# Patient Record
Sex: Female | Born: 1970 | Hispanic: No | Marital: Married | Smoking: Never smoker
Health system: Southern US, Community
[De-identification: ages and names within clinical notes are randomized; demographics above are authoritative.]

## PROBLEM LIST (undated history)

## (undated) DIAGNOSIS — E039 Hypothyroidism, unspecified: Secondary | ICD-10-CM

## (undated) DIAGNOSIS — F419 Anxiety disorder, unspecified: Secondary | ICD-10-CM

## (undated) DIAGNOSIS — K219 Gastro-esophageal reflux disease without esophagitis: Secondary | ICD-10-CM

## (undated) DIAGNOSIS — Z8541 Personal history of malignant neoplasm of cervix uteri: Secondary | ICD-10-CM

## (undated) DIAGNOSIS — E7800 Pure hypercholesterolemia, unspecified: Secondary | ICD-10-CM

## (undated) HISTORY — DX: Anxiety disorder, unspecified: F41.9

## (undated) HISTORY — PX: COLONOSCOPY: SHX174

## (undated) HISTORY — DX: Personal history of malignant neoplasm of cervix uteri: Z85.41

## (undated) HISTORY — DX: Gastro-esophageal reflux disease without esophagitis: K21.9

## (undated) HISTORY — DX: Pure hypercholesterolemia, unspecified: E78.00

## (undated) HISTORY — DX: Hypothyroidism, unspecified: E03.9

---

## 2002-01-19 HISTORY — PX: RADICAL HYSTERECTOMY: SHX2283

## 2008-06-07 ENCOUNTER — Ambulatory Visit: Payer: Self-pay | Admitting: Gastroenterology

## 2008-06-07 DIAGNOSIS — K219 Gastro-esophageal reflux disease without esophagitis: Secondary | ICD-10-CM

## 2008-06-07 DIAGNOSIS — R197 Diarrhea, unspecified: Secondary | ICD-10-CM

## 2008-06-07 LAB — CONVERTED CEMR LAB
BUN: 15 mg/dL (ref 6–23)
CO2: 29 meq/L (ref 19–32)
CRP, High Sensitivity: <1 (ref 0.00–5.00)
Calcium: 9.2 mg/dL (ref 8.4–10.5)
Chloride: 107 meq/L (ref 96–112)
Creatinine, Ser: 0.7 mg/dL (ref 0.4–1.2)
Eosinophils Relative: 3.2 % (ref 0.0–5.0)
GFR calc non Af Amer: 99.21 mL/min (ref >60)
Glucose, Bld: 90 mg/dL (ref 70–99)
HCT: 35.6 % — ABNORMAL LOW (ref 36.0–46.0)
Hemoglobin: 12.6 g/dL (ref 12.0–15.0)
Lymphs Abs: 1.8 10*3/uL (ref 0.7–4.0)
MCV: 91.4 fL (ref 78.0–100.0)
Monocytes Absolute: 0.6 10*3/uL (ref 0.1–1.0)
Monocytes Relative: 9.6 % (ref 3.0–12.0)
Neutro Abs: 3.3 10*3/uL (ref 1.4–7.7)
Platelets: 226 10*3/uL (ref 150.0–400.0)
Total Bilirubin: 0.7 mg/dL (ref 0.3–1.2)
WBC: 5.9 10*3/uL (ref 4.5–10.5)

## 2008-06-20 ENCOUNTER — Encounter: Payer: Self-pay | Admitting: Gastroenterology

## 2008-06-20 ENCOUNTER — Ambulatory Visit: Payer: Self-pay | Admitting: Gastroenterology

## 2008-06-25 ENCOUNTER — Encounter: Payer: Self-pay | Admitting: Gastroenterology

## 2014-07-03 ENCOUNTER — Encounter: Payer: Self-pay | Admitting: Gastroenterology

## 2014-08-20 HISTORY — PX: UPPER GASTROINTESTINAL ENDOSCOPY: SHX188

## 2014-09-10 ENCOUNTER — Encounter: Payer: Self-pay | Admitting: Gastroenterology

## 2014-09-10 ENCOUNTER — Ambulatory Visit (INDEPENDENT_AMBULATORY_CARE_PROVIDER_SITE_OTHER): Payer: BLUE CROSS/BLUE SHIELD | Admitting: Gastroenterology

## 2014-09-10 VITALS — BP 104/68 | HR 68 | Ht 66.0 in | Wt 148.6 lb

## 2014-09-10 DIAGNOSIS — R490 Dysphonia: Secondary | ICD-10-CM | POA: Diagnosis not present

## 2014-09-10 NOTE — Patient Instructions (Addendum)
Research will be speaking with you today. You have been scheduled for an endoscopy. Please follow written instructions given to you at your visit today. If you use inhalers (even only as needed), please bring them with you on the day of your procedure.

## 2014-09-10 NOTE — Assessment & Plan Note (Signed)
Hoarseness is likely a result of ongoing reflux.  Have asked to her nonacid reflux.  Recommendations #1 upper endoscopy #2 if #1 was not diagnostic would consider 48-hour probable pH test with impedance plethysmography #3 patient may consider enrollment in a reflux trial  CC Dr. Carlena Sax

## 2014-09-10 NOTE — Progress Notes (Signed)
_                                                                                                                History of Present Illness:  Ms. Banbury is a pleasant 44 year old white female referred at the request of Dr. Carlena Sax for evaluation of hoarseness.  This has been a problem for 9 months.  She's had severe hoarseness and intermittent sore throat despite taking various PPIs including dexilant Nexium.  She had been on Protonix for years.  She currently takes AcipHex in the morning and ranitidine at night.  She has been evaluated by ENT who felt that her changes on exam consistent with acid reflux.  Local cord nodules were seen.  She denies dysphagia or cough.  She's on no gastric irritants including non-steroidals.   Past Medical History  Diagnosis Date  . High cholesterol   . Hypothyroidism   . Anxiety   . Hx of cervical cancer   . GERD (gastroesophageal reflux disease)    Past Surgical History  Procedure Laterality Date  . Radical hysterectomy  2004   family history includes Crohn's disease in her sister; Hypertension in her mother; Non-Hodgkin's lymphoma in her father. Current Outpatient Prescriptions  Medication Sig Dispense Refill  . ALPRAZolam (XANAX) 0.5 MG tablet Take 0.5 mg by mouth as needed for anxiety.    . cetirizine (ZYRTEC) 10 MG tablet Take 10 mg by mouth daily.    . citalopram (CELEXA) 40 MG tablet Take 40 mg by mouth daily.    Marland Kitchen levothyroxine (SYNTHROID, LEVOTHROID) 100 MCG tablet Take 100 mcg by mouth daily before breakfast.    . MEGARED OMEGA-3 KRILL OIL 500 MG CAPS Take 1 tablet by mouth daily.    . montelukast (SINGULAIR) 10 MG tablet Take 10 mg by mouth at bedtime.    . nitrofurantoin, macrocrystal-monohydrate, (MACROBID) 100 MG capsule Take 100 mg by mouth daily.    . RABEprazole (ACIPHEX) 20 MG tablet Take 20 mg by mouth daily.    . ranitidine (ZANTAC) 150 MG capsule Take 150 mg by mouth at bedtime.    . rosuvastatin (CRESTOR) 10 MG  tablet Take 10 mg by mouth daily.     No current facility-administered medications for this visit.   Allergies as of 09/10/2014 - Review Complete 09/10/2014  Allergen Reaction Noted  . Codeine    . Hydrocodone    . Sulfonamide derivatives      reports that she has never smoked. She does not have any smokeless tobacco history on file. She reports that she drinks alcohol. She reports that she does not use illicit drugs.   Review of Systems: Pertinent positive and negative review of systems were noted in the above HPI section. All other review of systems were otherwise negative.  Vital signs were reviewed in today's medical record Physical Exam: General: Well developed , well nourished, no acute distress Skin: anicteric Head: Normocephalic and atraumatic Eyes:  sclerae anicteric, EOMI Ears: Normal auditory acuity Mouth: No deformity or lesions  Neck: Supple, no masses or thyromegaly Lymph Nodes: no lymphadenopathy Lungs: Clear throughout to auscultation Heart: Regular rate and rhythm; no murmurs, rubs or bruits Gastroinestinal: Soft, non tender and non distended. No masses, hepatosplenomegaly or hernias noted. Normal Bowel sounds.  There is no succussion splash Rectal:deferred Musculoskeletal: Symmetrical with no gross deformities  Skin: No lesions on visible extremities Pulses:  Normal pulses noted Extremities: No clubbing, cyanosis, edema or deformities noted Neurological: Alert oriented x 4, grossly nonfocal Cervical Nodes:  No significant cervical adenopathy Inguinal Nodes: No significant inguinal adenopathy Psychological:  Alert and cooperative. Normal mood and affect  See Assessment and Plan under Problem List

## 2014-09-11 ENCOUNTER — Other Ambulatory Visit: Payer: Self-pay

## 2014-09-11 ENCOUNTER — Encounter: Payer: Self-pay | Admitting: Gastroenterology

## 2014-09-11 ENCOUNTER — Ambulatory Visit (AMBULATORY_SURGERY_CENTER): Payer: BLUE CROSS/BLUE SHIELD | Admitting: Gastroenterology

## 2014-09-11 VITALS — BP 107/63 | HR 62 | Temp 97.5°F | Resp 24 | Ht 66.0 in | Wt 148.0 lb

## 2014-09-11 DIAGNOSIS — R490 Dysphonia: Secondary | ICD-10-CM

## 2014-09-11 MED ORDER — SODIUM CHLORIDE 0.9 % IV SOLN: 500.0000 mL | INTRAVENOUS | Status: DC

## 2014-09-11 NOTE — Progress Notes (Signed)
Report to PACU, RN, vss, BBS= Clear.  

## 2014-09-11 NOTE — Patient Instructions (Signed)
YOU HAD AN ENDOSCOPIC PROCEDURE TODAY AT THE [facility]:   Refer to the procedure report that was given to you for any specific questions about what was found during the examination.  If the procedure report does not answer your questions, please call your gastroenterologist to clarify.  If you requested that your care partner not be given the details of your procedure findings, then the procedure report has been included in a sealed envelope for you to review at your convenience later.  YOU SHOULD EXPECT: Some feelings of bloating in the abdomen. Passage of more gas than usual.  Walking can help get rid of the air that was put into your GI tract during the procedure and reduce the bloating. If you had a lower endoscopy (such as a colonoscopy or flexible sigmoidoscopy) you may notice spotting of blood in your stool or on the toilet paper. If you underwent a bowel prep for your procedure, you may not have a normal bowel movement for a few days.  Please Note:  You might notice some irritation and congestion in your nose or some drainage.  This is from the oxygen used during your procedure.  There is no need for concern and it should clear up in a day or so.  SYMPTOMS TO REPORT IMMEDIATELY:    Following upper endoscopy (EGD)  Vomiting of blood or coffee ground material  New chest pain or pain under the shoulder blades  Painful or persistently difficult swallowing  New shortness of breath  Fever of 100F or higher  Black, tarry-looking stools  For urgent or emergent issues, a gastroenterologist can be reached at any hour by calling [phone].   DIET: Your first meal following the procedure should be a small meal and then it is ok to progress to your normal diet. Heavy or fried foods are harder to digest and may make you feel nauseous or bloated.  Likewise, meals heavy in dairy and vegetables can increase bloating.  Drink plenty of fluids but you should avoid alcoholic beverages  for 24 hours.  ACTIVITY:  You should plan to take it easy for the rest of today and you should NOT DRIVE or use heavy machinery until tomorrow (because of the sedation medicines used during the test).    FOLLOW UP: Our staff will call the number listed on your records the next business day following your procedure to check on you and address any questions or concerns that you may have regarding the information given to you following your procedure. If we do not reach you, we will leave a message.  However, if you are feeling well and you are not experiencing any problems, there is no need to return our call.  We will assume that you have returned to your regular daily activities without incident.  If any biopsies were taken you will be contacted by phone or by letter within the next 1-3 weeks.  Please call us at [phone] if you have not heard about the biopsies in 3 weeks.    SIGNATURES/CONFIDENTIALITY: You and/or your care partner have signed paperwork which will be entered into your electronic medical record.  These signatures attest to the fact that that the information above on your After Visit Summary has been reviewed and is understood.  Full responsibility of the confidentiality of this discharge information lies with you and/or your care-partner. 

## 2014-09-11 NOTE — Op Note (Signed)
[facility] [address] [place], [address]   ENDOSCOPY PROCEDURE REPORT  PATIENT: Karen Obrien, Karen Obrien  MR#: [id number] BIRTHDATE: [birth year] , 43  yrs. old GENDER: female ENDOSCOPIST: Louis Meckel, MD REFERRED BY: PROCEDURE DATE:  09/11/2014 PROCEDURE:  EGD, diagnostic ASA CLASS:     Class II INDICATIONS:  hoarseness and history of esophageal reflux. MEDICATIONS: Monitored anesthesia care and Propofol 130 mg IV TOPICAL ANESTHETIC:  DESCRIPTION OF PROCEDURE: After the risks benefits and alternatives of the procedure were thoroughly explained, informed consent was obtained.  The LB [id number] endoscope was introduced through the mouth and advanced to the second portion of the duodenum , Without limitations.  The instrument was slowly withdrawn as the mucosa was fully examined.      EXAM: The esophagus and gastroesophageal junction were completely normal in appearance.  The stomach was entered and closely examined.The antrum, angularis, and lesser curvature were well visualized, including a retroflexed view of the cardia and fundus. The stomach wall was normally distensable.  The scope passed easily through the pylorus into the duodenum.  Retroflexed views revealed no abnormalities.     The scope was then withdrawn from the patient and the procedure completed.  COMPLICATIONS: There were no immediate complications.  ENDOSCOPIC IMPRESSION: Normal appearing esophagus and GE junction, the stomach was well visualized and normal in appearance, normal appearing duodenum  RECOMMENDATIONS: bravo 48-hour pH study with impedance plethysmography  REPEAT EXAM:  eSigned:  Louis Meckel, MD 09/11/2014 10:16 AM    CC: Adair Laundry, MD

## 2014-09-12 ENCOUNTER — Telehealth: Payer: Self-pay | Admitting: *Deleted

## 2014-09-12 NOTE — Telephone Encounter (Signed)
  Follow up Call-  Call back number 09/11/2014  Post procedure Call Back phone  # [phone]  Permission to leave phone message Yes     Patient questions:  Do you have a fever, pain , or abdominal swelling? No. Pain Score  0 *  Have you tolerated food without any problems? Yes.    Have you been able to return to your normal activities? Yes.    Do you have any questions about your discharge instructions: Diet   No. Medications  No. Follow up visit  No.  Do you have questions or concerns about your Care? No.  Actions: * If pain score is 4 or above: No action needed, pain <4.

## 2014-10-01 ENCOUNTER — Encounter: Payer: Self-pay | Admitting: Internal Medicine

## 2014-10-01 ENCOUNTER — Ambulatory Visit (HOSPITAL_COMMUNITY)
Admission: RE | Admit: 2014-10-01 | Discharge: 2014-10-01 | Disposition: A | Discharge status: Home / Self Care | Payer: BLUE CROSS/BLUE SHIELD | Source: Ambulatory Visit | Attending: Gastroenterology | Admitting: Gastroenterology

## 2014-10-01 ENCOUNTER — Encounter (HOSPITAL_COMMUNITY)
Admission: RE | Disposition: A | Discharge status: Home / Self Care | Payer: Self-pay | Source: Ambulatory Visit | Attending: Gastroenterology

## 2014-10-01 DIAGNOSIS — K219 Gastro-esophageal reflux disease without esophagitis: Secondary | ICD-10-CM | POA: Diagnosis not present

## 2014-10-01 DIAGNOSIS — R490 Dysphonia: Secondary | ICD-10-CM | POA: Diagnosis not present

## 2014-10-01 HISTORY — PX: PH IMPEDANCE STUDY: SHX5565

## 2014-10-01 HISTORY — PX: ESOPHAGEAL MANOMETRY: SHX5429

## 2014-10-01 SURGERY — IMPEDANCE PH STUDY, ESOPHAGUS

## 2014-10-01 MED ORDER — LIDOCAINE VISCOUS 2 % MT SOLN
OROMUCOSAL | Status: AC
  Filled 2014-10-01: qty 15

## 2014-10-01 SURGICAL SUPPLY — 2 items
FACESHIELD LNG OPTICON STERILE (SAFETY) IMPLANT
GLOVE BIO SURGEON STRL SZ8 (GLOVE) ×6 IMPLANT

## 2014-10-03 ENCOUNTER — Encounter (HOSPITAL_COMMUNITY): Payer: Self-pay | Admitting: Gastroenterology

## 2014-10-09 NOTE — Procedures (Signed)
Patient: Karen Obrien, Karen Obrien   [id number] Gender: Female Physician: Dr. Stan Head   DOB / Age: [birth year] Operator: Dow Adolph RN   Height: 5 ft 6 in Referring Physician:    Procedure: EMZ Examination Date: 10/01/2014    Swallow Composite (mean of 10 swallows) Resting Pressure Profile & Anatomy     Basal Pressures* LES, respiratory mean(mmHg) 18.3 (13-43) UES mean(mmHg) 54.8 (34-104)  Anatomy* LES proximal(cm) 41.6 LES intraabdominal(cm) 1.4 Esophageal length(cm) 24.0 Hiatal hernia No    Motility* Distal contr. integral(mmHg-cm-s) 1528.1 ([phone]) Distal contr. int. (highest)(mmHg-cm-s) 1802.2 Incomplete bolus clearance(%) 0  Residual Pressures* LES (mean)(mmHg) 3.9 (<15.0) UES (mean)(mmHg) 7.4 (<12.0)  *Notes. Motility values are mean among swallows; Normal values in (xxx.x):  Simultaneous contractions: Velocity > 8.0 cm/s; eSlv: eSleeve; 3SN, IRP, DCI, IBP - See manual definitions  Lower Esophageal Sphincter Region  Normal Esophageal Motility  Normal  Landmarks   Number of swallows evaluated 10       Proximal LES (from nares)(cm) 41.6  High Resolution Parameters        LES length(cm) 3.1 2.7-4.8     Distal contractile integral(mean)(mmHg-cm-s) 1528.1 [phone]      Esophageal length (LES-UES centers)(cm) 24.0      Distal contractile integral(highest)(mmHg-cm-s) 1802.2       Intraabdominal LES length(cm) 1.4      Contractile front velocity(cm/s) 2.6 <9.0      Hiatal hernia? No  Chicago Classification    LES Pressures       Distal latency 6.8       Pressure meas. method eSleeve,IRP      % failed (Chicago Classification) 0       Basal (respiratory min.)(mmHg) 10.5 4.8-32.0     % panesophageal pressurization 0       Basal (respiratory mean)(mmHg) 18.3 13-43     % premature contraction 0       Residual (mean)(mmHg) 3.9 <15.0     % rapid contraction 0          % large breaks 0          % small breaks 10      Impedance analysis           Incomplete bolus clearance(%) 0     Upper Esophageal Sphincter  Normal Pharyngeal / UES Motility  Normal  Mean basal pressure(mmHg) 54.8 34-104 No. swallows evaluated 10   Mean residual pressure(mmHg) 7.4 <12.0 Evaluated @ 3.0 & N/A above UES           Mean peak pressure(mmHg) 6.1       Chicago Classification Findings*  No Chicago Classification abnormality found * Findings are based on published Chicago Classification scheme and are only intended to serve as a guide for patient diagnosis   Procedure  After confirmation of potential allergies, a topical analgesic was used to numb the nares followed by trans-nasal insertion of a High Resolution Manometry Catheter. Pressure bands of both UES and LES were observed on the color contour. Patient instructed to take deep breath to verify placement of catheter; diaphragmatic pinch noted on inspiration. Patient was assisted to supine position and catheter was stabilized. Patient encouraged to relax while acclimating to catheter for approximately 5 minutes. A 30 second baseline pressure was obtained to identify the UES and LES followed by a series of wet swallows using 5 ccs room temperature water to assess esophageal motility. At the conclusion of the procedure; the catheter was removed.   Indications  GERD, hoarseness   Interpretation /  Findings   Normal Esophageal Manometry Study. Impedance results to follow  Iva Boop, MD, [facility] E sign 10/09/2014 1330

## 2014-12-18 ENCOUNTER — Telehealth: Payer: Self-pay | Admitting: Gastroenterology

## 2014-12-18 NOTE — Telephone Encounter (Signed)
She had a Bravo 24 hr impedence study. The results are in her chart. Could you review this for me and give me your recommendations?

## 2014-12-21 NOTE — Telephone Encounter (Signed)
Esophageal manometry report was normal, I couldn't find the 48 hr pH Bravo report

## 2014-12-24 NOTE — Telephone Encounter (Signed)
Left message with the information. DPR is on file.

## 2015-02-14 DIAGNOSIS — R490 Dysphonia: Secondary | ICD-10-CM | POA: Insufficient documentation

## 2015-02-18 ENCOUNTER — Telehealth: Payer: Self-pay | Admitting: Gastroenterology

## 2015-02-18 NOTE — Telephone Encounter (Signed)
Patient given results and recommendations. She states she ended up in ED and has seen Dr. Gerilyn Nestle now.

## 2015-02-18 NOTE — Telephone Encounter (Signed)
Rene Kocher this patient had a 24 HR pH impedance study done for Dr. Arlyce Dice back in September but it was not known to use as it occurred after Dr. Arlyce Dice had left the clinic. I interpreted the study for him when asked by the [facility] endo dept.   Summary and Interpretation: - normal Demeester score, suggesting no pathologic acid reflux - symptoms did not correlate to reflux episodes This is a normal study. Records reviewed showing the patient's symptom is voice hoarseness. This study would indicate that her hoarse voice is not the result of reflux. If she is not on PPI, there is no pathologic acid reflux. If she is ON PPI, it is controlling reflux well and if symptoms persist, they would not seem to be related to reflux  Recommend: - based on this exam there is no evidence of reflux to cause her symptoms of hoarse voice. If her symptoms persist and she has not seen ENT yet, we can place a consult for her to see them if she wishes to have this done. Can you please otherwise let her know why her results too so long to get into the system, we apologize for the delay. Thanks

## 2015-03-12 ENCOUNTER — Ambulatory Visit (INDEPENDENT_AMBULATORY_CARE_PROVIDER_SITE_OTHER): Payer: BLUE CROSS/BLUE SHIELD | Admitting: Internal Medicine

## 2015-03-12 ENCOUNTER — Encounter (INDEPENDENT_AMBULATORY_CARE_PROVIDER_SITE_OTHER): Payer: Self-pay | Admitting: *Deleted

## 2015-03-12 ENCOUNTER — Encounter (INDEPENDENT_AMBULATORY_CARE_PROVIDER_SITE_OTHER): Payer: Self-pay | Admitting: Internal Medicine

## 2015-03-12 VITALS — BP 98/70 | HR 68 | Temp 97.7°F | Resp 18 | Ht 66.0 in | Wt 157.9 lb

## 2015-03-12 DIAGNOSIS — R490 Dysphonia: Secondary | ICD-10-CM | POA: Diagnosis not present

## 2015-03-12 DIAGNOSIS — K219 Gastro-esophageal reflux disease without esophagitis: Secondary | ICD-10-CM | POA: Diagnosis not present

## 2015-03-12 MED ORDER — PANTOPRAZOLE SODIUM 40 MG PO TBEC: 40.0000 mg | DELAYED_RELEASE_TABLET | Freq: Two times a day (BID) | ORAL | Status: AC

## 2015-03-12 NOTE — Patient Instructions (Addendum)
Physician will call with results of gastric emptying study. Remember to take pantoprazole 30 to 60 minutes before breakfast and evening meal.

## 2015-03-12 NOTE — Progress Notes (Signed)
Presenting complaint;  Chronic hoarseness unresponsive to PPI therapy.  History of present illness.:  Patient is 45 year old Caucasian female who is referred through the courtesy of Dr. Wende Crease for 45GI evaluation. Patient was admitted to [facility] on 01/25/2015 for severe chest pain. Troponin levels were negative. Stress echo was also within normal limits. Chest pain was felt to be noncardiac possibly due to GERD. Patient states she developed sore throat 45 years ago. She was seen by ENT specialist in [place] and was diagnosed with GERD. She was begun on pantoprazole which she took every day for 5 years with satisfactory symptom control. She also slept on 2 pillows. Her sore throat relapsed about 5 years ago and she was switched to Dexilant which provided relief for 4 years. She developed hoarseness last year and was once again seen by ENT specialist since she was switched to Rebaprazole along with Zantac at bedtime. However her hoarseness has not improved. She was seen by Dr. Louis Meckel in August 45 last year. She had normal EGD, normal esophageal manometry and normal pH and impedance study. Dr. Arlyce Dice was moved and she is not able to follow-up with him. In addition to hoarseness she also has intermittent sore throat. She has sporadic cough. She has never experienced heartburn. She is watching her diet closely. She drinks no more than 46 cups of tea a day. She says she is been treated for pneumonia on 3 different occasions. She was treated in October 2016 with CPAP and prednisone and again with Levaquin in November last year. When she was admitted to [facility] last month she was told she had pneumonia and was sent home on Levaquin. Chest film report suggested atelectasis vs  infiltrate. She has good appetite. Since she has gained 35 pounds in the last 4 years she has been watching her diet. She has occasional dysphagia with solids. She denies vomiting abdominal pain melena or rectal  bleeding.    Current Medications: Outpatient Encounter Prescriptions as of 03/12/2015  Medication Sig  . ALPRAZolam (XANAX) 0.5 MG tablet Take 0.5 mg by mouth as needed for anxiety.  . citalopram (CELEXA) 40 MG tablet Take 20 mg by mouth daily.   Marland Kitchen levothyroxine (SYNTHROID, LEVOTHROID) 100 MCG tablet Take 100 mcg by mouth daily before breakfast.  . nitrofurantoin, macrocrystal-monohydrate, (MACROBID) 100 MG capsule Take 100 mg by mouth daily.  . RABEprazole (ACIPHEX) 20 MG tablet Take 20 mg by mouth daily.  . rosuvastatin (CRESTOR) 10 MG tablet Take 10 mg by mouth daily.  . cetirizine (ZYRTEC) 10 MG tablet Take 10 mg by mouth daily. Reported on 03/12/2015  . [DISCONTINUED] MEGARED OMEGA-3 KRILL OIL 500 MG CAPS Take 1 tablet by mouth daily. Reported on 03/12/2015  . [DISCONTINUED] montelukast (SINGULAIR) 10 MG tablet Take 10 mg by mouth at bedtime. Reported on 03/12/2015  . [DISCONTINUED] ranitidine (ZANTAC) 150 MG capsule Take 150 mg by mouth at bedtime. Reported on 03/12/2015   No facility-administered encounter medications on file as of 03/12/2015.   Past medical history: Hypothyroidism was diagnosed 4 years ago. Hyperlipidemia. She has been on therapy for 18 months. Stress disorder. Hysterectomy in 2004. 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 She was hospitalized for UTI in December 2015 and needed PICC line. She has been on Macrobid since then and has not had any more UTI. Recent hospitalization for severe chest pain and noninvasive cardiac workup negative as above. Normal colonoscopy in June 2010.  Allergies: Allergies  Allergen  Reactions  . Codeine   . Hydrocodone   . Sulfonamide Derivatives     Family history: Father had CAD diabetes mellitus and died of non-Hodgkin's lymphoma at age 46. Mother died at age 46. She had hypertension and CLL. She has 3 brothers and 4 sisters. One sister has hypothyroidism and  Crohn's disease. Other sisters are in good health. 2 brothers have coronary artery disease. One has undergone CABG twice and the other brother has had coronary stenting. Third brother has hyperlipidemia.  Social history:  She is married and has a son age 46 in good health. She works as a Interior and spatial designer. She is never smoked cigarettes or drank alcohol.  Physical examination:  Blood pressure 98/70, pulse 68, temperature 97.7 F (36.5 C), temperature source Oral, resp. rate 18, height  (1.676 m), weight 157 lb 14.4 oz (71.623 kg). Patient is alert and in no acute distress. Conjunctiva is pink. Sclera is nonicteric Oropharyngeal mucosa is normal. No neck masses or thyromegaly noted. Cardiac exam with regular rhythm normal S1 and S2. No murmur or gallop noted. Lungs are clear to auscultation. Abdomen is symmetrical soft and nontender without organomegaly or masses.  No LE edema or clubbing noted.  Labs/studies Results: EGD on 09/11/2014 by Dr. Melvia Heaps was normal. Esophageal manometry on 10/01/2014 was within normal limits. Esophageal pH and impedance study on 10/01/2014 was within normal limits.  Study was performed on PPI therapy.   Assessment:  #1. Chronic GERD with laryngeal symptoms unresponsive to current therapy. She had extensive evaluation last year with normal EGD normal esophageal manometry, impedance study and pH study on therapy. Recent brief hospitalization at [facility](6 weeks ago) for chest pain possibly due to GERD as noninvasive cardiac evaluation was negative. Given chronic symptoms need to rule out gastroparesis.   Plan:  Anti-reflux measures reinforced. Discontinue Rebaprazole. Begin pantoprazole 40 mg by mouth before breakfast and evening meal daily. Solid-phase gastric emptying study. Office visit in 2 months.

## 2015-03-13 DIAGNOSIS — N319 Neuromuscular dysfunction of bladder, unspecified: Secondary | ICD-10-CM | POA: Insufficient documentation

## 2015-03-18 ENCOUNTER — Encounter (HOSPITAL_COMMUNITY): Admission: RE | Admit: 2015-03-18 | Payer: BLUE CROSS/BLUE SHIELD | Source: Ambulatory Visit

## 2015-03-25 ENCOUNTER — Encounter (HOSPITAL_COMMUNITY)
Admission: RE | Admit: 2015-03-25 | Discharge: 2015-03-25 | Disposition: A | Discharge status: Home / Self Care | Payer: BLUE CROSS/BLUE SHIELD | Source: Ambulatory Visit | Attending: Internal Medicine | Admitting: Internal Medicine

## 2015-03-25 ENCOUNTER — Encounter (HOSPITAL_COMMUNITY): Payer: Self-pay

## 2015-03-25 DIAGNOSIS — K219 Gastro-esophageal reflux disease without esophagitis: Secondary | ICD-10-CM | POA: Insufficient documentation

## 2015-03-25 MED ORDER — TECHNETIUM TC 99M SULFUR COLLOID
2.0000 | Freq: Once | INTRAVENOUS | Status: AC | PRN
Start: 2015-03-25 — End: 2015-03-25
  Administered 2015-03-25: 2 via ORAL

## 2015-03-26 ENCOUNTER — Encounter (HOSPITAL_COMMUNITY): Payer: BLUE CROSS/BLUE SHIELD

## 2015-03-28 ENCOUNTER — Encounter (INDEPENDENT_AMBULATORY_CARE_PROVIDER_SITE_OTHER): Payer: Self-pay

## 2015-05-14 ENCOUNTER — Ambulatory Visit (INDEPENDENT_AMBULATORY_CARE_PROVIDER_SITE_OTHER): Payer: BLUE CROSS/BLUE SHIELD | Admitting: Internal Medicine

## 2015-06-21 ENCOUNTER — Encounter: Payer: Self-pay | Admitting: Gastroenterology

## 2015-08-13 ENCOUNTER — Ambulatory Visit (INDEPENDENT_AMBULATORY_CARE_PROVIDER_SITE_OTHER): Payer: BLUE CROSS/BLUE SHIELD | Admitting: Internal Medicine

## 2015-08-13 ENCOUNTER — Other Ambulatory Visit (INDEPENDENT_AMBULATORY_CARE_PROVIDER_SITE_OTHER): Payer: Self-pay | Admitting: Internal Medicine

## 2015-08-13 ENCOUNTER — Encounter (INDEPENDENT_AMBULATORY_CARE_PROVIDER_SITE_OTHER): Payer: Self-pay | Admitting: *Deleted

## 2015-08-13 ENCOUNTER — Encounter (INDEPENDENT_AMBULATORY_CARE_PROVIDER_SITE_OTHER): Payer: Self-pay | Admitting: Internal Medicine

## 2015-08-13 ENCOUNTER — Encounter (INDEPENDENT_AMBULATORY_CARE_PROVIDER_SITE_OTHER): Payer: Self-pay

## 2015-08-13 ENCOUNTER — Telehealth (INDEPENDENT_AMBULATORY_CARE_PROVIDER_SITE_OTHER): Payer: Self-pay | Admitting: *Deleted

## 2015-08-13 VITALS — BP 94/70 | HR 60 | Temp 98.2°F | Ht 66.0 in | Wt 159.0 lb

## 2015-08-13 DIAGNOSIS — R1030 Lower abdominal pain, unspecified: Secondary | ICD-10-CM | POA: Diagnosis not present

## 2015-08-13 DIAGNOSIS — K219 Gastro-esophageal reflux disease without esophagitis: Secondary | ICD-10-CM

## 2015-08-13 DIAGNOSIS — K625 Hemorrhage of anus and rectum: Secondary | ICD-10-CM | POA: Insufficient documentation

## 2015-08-13 MED ORDER — PEG 3350-KCL-NA BICARB-NACL 420 G PO SOLR: 4000.0000 mL | Freq: Once | ORAL | 0 refills | Status: AC

## 2015-08-13 NOTE — Telephone Encounter (Signed)
Error   This encounter was created in error - please disregard. 

## 2015-08-13 NOTE — Patient Instructions (Signed)
The risks and benefits such as perforation, bleeding, and infection were reviewed with the patient and is agreeable. 

## 2015-08-13 NOTE — Progress Notes (Signed)
Subjective:    Patient ID: Karen Obrien, female    DOB: [birth year], 45 y.o.   MRN: [id number]  HPI Presents today with c/o rectal bleeding (BRRB). The rectal bleeding x 2 days (last Wednesday and Thursday). She says she had an upset stomach. She says she was not constipated. She describes as a significant amt  Appetite is good. No weight loss. She usually has a BM every 3 days. She shows me a picture in the office and there is BRRB in the commode.  There is no abdominal pain. She takes Xanax about once a month.   Has had LLQ pain for months. No fever associated with her symptoms. GERD controlled with Protonix BID.   ]\     08/08/2015 H and H 12.7 and 38.1  06/20/2008 Colonoscopy: Dr. Arlyce Dice: Normal Colonoscopy Biopsy:   COLON, BIOPSY: BENIGN COLONIC MUCOSA. NO SIGNIFICANT  INFLAMMATION OR OTHER ABNORMALITIES IDENTIFIED. EGD on 09/11/2014 by Dr. Melvia Heaps was normal. Esophageal manometry on 10/01/2014 was within normal limits. Esophageal pH and impedance study on 10/01/2014 was within normal limits.  Study was performed on PPI therapy.  Review of Systems Past Medical History:  Diagnosis Date  . Anxiety   . GERD (gastroesophageal reflux disease)   . High cholesterol   . Hx of cervical cancer   . Hypothyroidism     Past Surgical History:  Procedure Laterality Date  . COLONOSCOPY     Dr. Talbert Nan , Dr.Shiflett  . ESOPHAGEAL MANOMETRY N/A 10/01/2014   Procedure: ESOPHAGEAL MANOMETRY (EM);  Surgeon: Louis Meckel, MD;  Location: [facility];  Service: Endoscopy;  Laterality: N/A;  . PH IMPEDANCE STUDY N/A 10/01/2014   Procedure: PH IMPEDANCE STUDY;  Surgeon: Louis Meckel, MD;  Location: [facility];  Service: Endoscopy;  Laterality: N/A;  . RADICAL HYSTERECTOMY  2004  . UPPER GASTROINTESTINAL ENDOSCOPY  08/2014   Dr. Joselyn Glassman    Allergies  Allergen Reactions  . Codeine   . Hydrocodone   . Sulfonamide Derivatives     Current Outpatient Prescriptions on File  Prior to Visit  Medication Sig Dispense Refill  . ALPRAZolam (XANAX) 0.5 MG tablet Take 0.5 mg by mouth as needed for anxiety.    . cetirizine (ZYRTEC) 10 MG tablet Take 10 mg by mouth daily. Reported on 03/12/2015    . levothyroxine (SYNTHROID, LEVOTHROID) 100 MCG tablet Take 100 mcg by mouth daily before breakfast.    . nitrofurantoin, macrocrystal-monohydrate, (MACROBID) 100 MG capsule Take 100 mg by mouth daily.    . pantoprazole (PROTONIX) 40 MG tablet Take 1 tablet (40 mg total) by mouth 2 (two) times daily before a meal. 60 tablet 5  . rosuvastatin (CRESTOR) 10 MG tablet Take 5 mg by mouth daily.     . citalopram (CELEXA) 40 MG tablet Take 20 mg by mouth daily.      No current facility-administered medications on file prior to visit.        Objective:   Physical Exam   Today's Vitals   08/13/15 1133  BP: 94/70  Pulse: 60  Temp: 98.2 F (36.8 C)  Weight: 159 lb (72.1 kg)  Height:  (1.676 m)   Alert and oriented. Skin warm and dry. Oral mucosa is moist.   . Sclera anicteric, conjunctivae is pink. Thyroid not enlarged. No cervical lymphadenopathy. Lungs clear. Heart regular rate and rhythm.  Abdomen is soft. Bowel sounds are positive. No hepatomegaly. No abdominal masses felt.   Tenderness. LLQ.  No edema to  lower extremities.        Assessment & Plan:  Rectal bleeding. Colonic neoplasm needs to be ruled out.  Ulcer, Polyp also in the differential.  CT abdominal pelvis with CM for LLQ pain to rule out diverticulitis.  GERD controlled with Protonix BID.

## 2015-08-13 NOTE — Telephone Encounter (Signed)
Patient needs trilyte 

## 2015-08-26 ENCOUNTER — Ambulatory Visit (HOSPITAL_COMMUNITY)
Admission: RE | Admit: 2015-08-26 | Discharge: 2015-08-26 | Disposition: A | Discharge status: Home / Self Care | Payer: BLUE CROSS/BLUE SHIELD | Source: Ambulatory Visit | Attending: Internal Medicine | Admitting: Internal Medicine

## 2015-08-26 ENCOUNTER — Encounter (HOSPITAL_COMMUNITY): Payer: Self-pay

## 2015-08-26 DIAGNOSIS — R938 Abnormal findings on diagnostic imaging of other specified body structures: Secondary | ICD-10-CM | POA: Diagnosis not present

## 2015-08-26 DIAGNOSIS — R1030 Lower abdominal pain, unspecified: Secondary | ICD-10-CM | POA: Insufficient documentation

## 2015-08-26 DIAGNOSIS — K625 Hemorrhage of anus and rectum: Secondary | ICD-10-CM | POA: Insufficient documentation

## 2015-08-26 MED ORDER — IOPAMIDOL (ISOVUE-300) INJECTION 61%
100.0000 mL | Freq: Once | INTRAVENOUS | Status: AC | PRN
  Administered 2015-08-26: 100 mL via INTRAVENOUS

## 2015-09-05 ENCOUNTER — Telehealth (INDEPENDENT_AMBULATORY_CARE_PROVIDER_SITE_OTHER): Payer: Self-pay | Admitting: Internal Medicine

## 2015-09-05 DIAGNOSIS — K5289 Other specified noninfective gastroenteritis and colitis: Secondary | ICD-10-CM

## 2015-09-05 MED ORDER — CIPROFLOXACIN HCL 500 MG PO TABS: 500.0000 mg | ORAL_TABLET | Freq: Two times a day (BID) | ORAL | 0 refills | Status: DC

## 2015-09-05 MED ORDER — METRONIDAZOLE 250 MG PO TABS: 250.0000 mg | ORAL_TABLET | Freq: Three times a day (TID) | ORAL | 0 refills | Status: DC

## 2015-09-05 NOTE — Telephone Encounter (Signed)
Results given to patient. Cipro and Flagyl called to her pharmacy. CBC and Sedrate: She will have this drawn Monday.

## 2015-09-05 NOTE — Telephone Encounter (Signed)
Have already talked with patient.

## 2015-09-05 NOTE — Telephone Encounter (Signed)
Patient called, stated she was returning Terri's call.  She stated that she'd have her phone on the rest of the day, to please try her back.  [phone]

## 2015-09-09 LAB — CBC WITH DIFFERENTIAL/PLATELET
Basophils Absolute: 56 cells/uL (ref 0–200)
Basophils Relative: 1 %
EOS PCT: 4 %
Eosinophils Absolute: 224 cells/uL (ref 15–500)
HCT: 37.6 % (ref 35.0–45.0)
HEMOGLOBIN: 13 g/dL (ref 11.7–15.5)
LYMPHS ABS: 1960 {cells}/uL (ref 850–3900)
LYMPHS PCT: 35 %
MCH: 31 pg (ref 27.0–33.0)
MCHC: 34.6 g/dL (ref 32.0–36.0)
MCV: 89.5 fL (ref 80.0–100.0)
MONOS PCT: 12 %
MPV: 9.4 fL (ref 7.5–12.5)
Monocytes Absolute: 672 cells/uL (ref 200–950)
NEUTROS PCT: 48 %
Neutro Abs: 2688 cells/uL (ref 1500–7800)
PLATELETS: 264 10*3/uL (ref 140–400)
RBC: 4.2 MIL/uL (ref 3.80–5.10)
RDW: 12.2 % (ref 11.0–15.0)
WBC: 5.6 10*3/uL (ref 3.8–10.8)

## 2015-09-10 LAB — SEDIMENTATION RATE: SED RATE: 1 mm/h (ref 0–20)

## 2015-10-09 ENCOUNTER — Encounter (HOSPITAL_COMMUNITY)
Admission: RE | Disposition: A | Discharge status: Home / Self Care | Payer: Self-pay | Source: Ambulatory Visit | Attending: Internal Medicine

## 2015-10-09 ENCOUNTER — Encounter (HOSPITAL_COMMUNITY): Payer: Self-pay

## 2015-10-09 ENCOUNTER — Ambulatory Visit (HOSPITAL_COMMUNITY)
Admission: RE | Admit: 2015-10-09 | Discharge: 2015-10-09 | Disposition: A | Discharge status: Home / Self Care | Payer: BLUE CROSS/BLUE SHIELD | Source: Ambulatory Visit | Attending: Internal Medicine | Admitting: Internal Medicine

## 2015-10-09 DIAGNOSIS — K625 Hemorrhage of anus and rectum: Secondary | ICD-10-CM

## 2015-10-09 DIAGNOSIS — K648 Other hemorrhoids: Secondary | ICD-10-CM | POA: Insufficient documentation

## 2015-10-09 DIAGNOSIS — Z79899 Other long term (current) drug therapy: Secondary | ICD-10-CM | POA: Insufficient documentation

## 2015-10-09 DIAGNOSIS — E7800 Pure hypercholesterolemia, unspecified: Secondary | ICD-10-CM | POA: Diagnosis not present

## 2015-10-09 DIAGNOSIS — E039 Hypothyroidism, unspecified: Secondary | ICD-10-CM | POA: Diagnosis not present

## 2015-10-09 DIAGNOSIS — F419 Anxiety disorder, unspecified: Secondary | ICD-10-CM | POA: Diagnosis not present

## 2015-10-09 DIAGNOSIS — Z8541 Personal history of malignant neoplasm of cervix uteri: Secondary | ICD-10-CM | POA: Insufficient documentation

## 2015-10-09 DIAGNOSIS — Z8379 Family history of other diseases of the digestive system: Secondary | ICD-10-CM | POA: Insufficient documentation

## 2015-10-09 DIAGNOSIS — R933 Abnormal findings on diagnostic imaging of other parts of digestive tract: Secondary | ICD-10-CM

## 2015-10-09 DIAGNOSIS — K219 Gastro-esophageal reflux disease without esophagitis: Secondary | ICD-10-CM | POA: Diagnosis not present

## 2015-10-09 HISTORY — PX: COLONOSCOPY: SHX5424

## 2015-10-09 SURGERY — COLONOSCOPY
Anesthesia: Moderate Sedation

## 2015-10-09 MED ORDER — MEPERIDINE HCL 50 MG/ML IJ SOLN
INTRAMUSCULAR | Status: AC
  Filled 2015-10-09: qty 1

## 2015-10-09 MED ORDER — MIDAZOLAM HCL 5 MG/5ML IJ SOLN
INTRAMUSCULAR | Status: AC
  Filled 2015-10-09: qty 10

## 2015-10-09 MED ORDER — SODIUM CHLORIDE 0.9 % IV SOLN
INTRAVENOUS | Status: DC
  Administered 2015-10-09: 1000 mL via INTRAVENOUS

## 2015-10-09 MED ORDER — MEPERIDINE HCL 50 MG/ML IJ SOLN
INTRAMUSCULAR | Status: DC | PRN
  Administered 2015-10-09 (×2): 25 mg via INTRAVENOUS

## 2015-10-09 MED ORDER — STERILE WATER FOR IRRIGATION IR SOLN
Status: DC | PRN
  Administered 2015-10-09: 100 mL

## 2015-10-09 MED ORDER — MIDAZOLAM HCL 5 MG/5ML IJ SOLN
INTRAMUSCULAR | Status: DC | PRN
  Administered 2015-10-09 (×3): 2 mg via INTRAVENOUS

## 2015-10-09 NOTE — Discharge Instructions (Signed)
Resume usual medications and high fiber diet. No driving for 24 hours. Next screening exam in 10 years.  Colonoscopy, Care After Refer to this sheet in the next few weeks. These instructions provide you with information on caring for yourself after your procedure. Your health care provider may also give you more specific instructions. Your treatment has been planned according to current medical practices, but problems sometimes occur. Call your health care provider if you have any problems or questions after your procedure.  Dr Karilyn Cota [phone]; after hours call hospital and have GI doctor on call to be paged. WHAT TO EXPECT AFTER THE PROCEDURE  After your procedure, it is typical to have the following:  A small amount of blood in your stool.  Moderate amounts of gas and mild abdominal cramping or bloating. HOME CARE INSTRUCTIONS  Do not drive, operate machinery, or sign important documents for 24 hours.  You may shower and resume your regular physical activities, but move at a slower pace for the first 24 hours.  Take frequent rest periods for the first 24 hours.  Walk around or put a warm pack on your abdomen to help reduce abdominal cramping and bloating.  Drink enough fluids to keep your urine clear or pale yellow.  You may resume your normal diet as instructed by your health care provider. Avoid heavy or fried foods that are hard to digest.  Avoid drinking alcohol for 24 hours or as instructed by your health care provider.  Only take over-the-counter or prescription medicines as directed by your health care provider. SEEK MEDICAL CARE IF: You have persistent spotting of blood in your stool 2-3 days after the procedure. SEEK IMMEDIATE MEDICAL CARE IF:  You have more than a small spotting of blood in your stool.  You pass large blood clots in your stool.  Your abdomen is swollen (distended).  You have nausea or vomiting.  You have a fever.  You have increasing  abdominal pain that is not relieved with medicine.   This information is not intended to replace advice given to you by your health care provider. Make sure you discuss any questions you have with your health care provider.   Document Released: 08/20/2003 Document Revised: 10/26/2012 Document Reviewed: 09/12/2012 Elsevier Interactive Patient Education 2016 Elsevier Inc.  High-Fiber Diet Fiber, also called dietary fiber, is a type of carbohydrate found in fruits, vegetables, whole grains, and beans. A high-fiber diet can have many health benefits. Your health care provider may recommend a high-fiber diet to help:  Prevent constipation. Fiber can make your bowel movements more regular.  Lower your cholesterol.  Relieve hemorrhoids, uncomplicated diverticulosis, or irritable bowel syndrome.  Prevent overeating as part of a weight-loss plan.  Prevent heart disease, type 2 diabetes, and certain cancers. WHAT IS MY PLAN? The recommended daily intake of fiber includes:  38 grams for men under age 63.  30 grams for men over age 4.  25 grams for women under age 12.  21 grams for women over age 70. You can get the recommended daily intake of dietary fiber by eating a variety of fruits, vegetables, grains, and beans. Your health care provider may also recommend a fiber supplement if it is not possible to get enough fiber through your diet. WHAT DO I NEED TO KNOW ABOUT A HIGH-FIBER DIET?  Fiber supplements have not been widely studied for their effectiveness, so it is better to get fiber through food sources.  Always check the fiber content on thenutrition facts  label of any prepackaged food. Look for foods that contain at least 5 grams of fiber per serving.  Ask your dietitian if you have questions about specific foods that are related to your condition, especially if those foods are not listed in the following section.  Increase your daily fiber consumption gradually. Increasing your  intake of dietary fiber too quickly may cause bloating, cramping, or gas.  Drink plenty of water. Water helps you to digest fiber. WHAT FOODS CAN I EAT? Grains Whole-grain breads. Multigrain cereal. Oats and oatmeal. Brown rice. Barley. Bulgur wheat. Millet. Bran muffins. Popcorn. Rye wafer crackers. Vegetables Sweet potatoes. Spinach. Kale. Artichokes. Cabbage. Broccoli. Green peas. Carrots. Squash. Fruits Berries. Pears. Apples. Oranges. Avocados. Prunes and raisins. Dried figs. Meats and Other Protein Sources Navy, kidney, pinto, and soy beans. Split peas. Lentils. Nuts and seeds. Dairy Fiber-fortified yogurt. Beverages Fiber-fortified soy milk. Fiber-fortified orange juice. Other Fiber bars. The items listed above may not be a complete list of recommended foods or beverages. Contact your dietitian for more options. WHAT FOODS ARE NOT RECOMMENDED? Grains White bread. Pasta made with refined flour. White rice. Vegetables Fried potatoes. Canned vegetables. Well-cooked vegetables.  Fruits Fruit juice. Cooked, strained fruit. Meats and Other Protein Sources Fatty cuts of meat. Fried Environmental education officerpoultry or fried fish. Dairy Milk. Yogurt. Cream cheese. Sour cream. Beverages Soft drinks. Other Cakes and pastries. Butter and oils. The items listed above may not be a complete list of foods and beverages to avoid. Contact your dietitian for more information. WHAT ARE SOME TIPS FOR INCLUDING HIGH-FIBER FOODS IN MY DIET?  Eat a wide variety of high-fiber foods.  Make sure that half of all grains consumed each day are whole grains.  Replace breads and cereals made from refined flour or white flour with whole-grain breads and cereals.  Replace white rice with brown rice, bulgur wheat, or millet.  Start the day with a breakfast that is high in fiber, such as a cereal that contains at least 5 grams of fiber per serving.  Use beans in place of meat in soups, salads, or pasta.  Eat high-fiber  snacks, such as berries, raw vegetables, nuts, or popcorn.   This information is not intended to replace advice given to you by your health care provider. Make sure you discuss any questions you have with your health care provider.   Document Released: 01/05/2005 Document Revised: 01/26/2014 Document Reviewed: 06/20/2013 Elsevier Interactive Patient Education [employer]2016 Elsevier Inc.

## 2015-10-09 NOTE — Op Note (Signed)
[facility] Patient Name: Karen Obrien Procedure Date: 10/09/2015 1:54 PM MRN: [id number] Date of Birth: 11/12/1970 Attending MD: Lionel DecemberNajeeb Aedan Geimer , MD CSN: [id number] Age: [age] Admit Type: Outpatient Procedure:                Colonoscopy Indications:              Rectal bleeding, Abnormal CT of the GI tract Providers:                Lionel DecemberNajeeb Ilisha Blust, MD, Edrick Kinsammy Vaught, RN, Burke Keelsrisann                            Tilley, Technician Referring MD:             Delorse Lekiane W Blair, FNP Medicines:                Meperidine 50 mg IV, Midazolam 6 mg IV Complications:            No immediate complications. Estimated Blood Loss:     Estimated blood loss: none. Procedure:                Pre-Anesthesia Assessment:                           - Prior to the procedure, a History and Physical                            was performed, and patient medications and                            allergies were reviewed. The patient's tolerance of                            previous anesthesia was also reviewed. The risks                            and benefits of the procedure and the sedation                            options and risks were discussed with the patient.                            All questions were answered, and informed consent                            was obtained. Prior Anticoagulants: The patient has                            taken no previous anticoagulant or antiplatelet                            agents. ASA Grade Assessment: II - A patient with                            mild systemic disease. After reviewing the risks  and benefits, the patient was deemed in                            satisfactory condition to undergo the procedure.                           After obtaining informed consent, the colonoscope                            was passed under direct vision. Throughout the                            procedure, the patient's blood pressure, pulse, and                    oxygen saturations were monitored continuously. The                            EC-[id number] ([id number]) scope was introduced through                            the anus and advanced to the the cecum, identified                            by appendiceal orifice and ileocecal valve. The                            colonoscopy was performed without difficulty. The                            patient tolerated the procedure well. The quality                            of the bowel preparation was good. The ileocecal                            valve, appendiceal orifice, and rectum were                            photographed. Scope In: 2:28:07 PM Scope Out: 2:44:58 PM Scope Withdrawal Time: 0 hours 8 minutes 56 seconds  Total Procedure Duration: 0 hours 16 minutes 51 seconds  Findings:      The perianal and digital rectal examinations were normal.      The colon (entire examined portion) appeared normal.      Internal hemorrhoids were found during retroflexion. The hemorrhoids       were small. Impression:               - The entire examined colon is normal.                           - Internal hemorrhoids.                           - No specimens collected. Moderate Sedation:      Moderate (conscious) sedation was administered by  the endoscopy nurse       and supervised by the endoscopist. The following parameters were       monitored: oxygen saturation, heart rate, blood pressure, CO2       capnography and response to care. Total physician intraservice time was       21 minutes. Recommendation:           - Patient has a contact number available for                            emergencies. The signs and symptoms of potential                            delayed complications were discussed with the                            patient. Return to normal activities tomorrow.                            Written discharge instructions were provided to the                             patient.                           - High fiber diet today.                           - Continue present medications.                           - Repeat colonoscopy in 10 years for screening                            purposes. Procedure Code(s):        --- Professional ---                           [phone], Colonoscopy, flexible; diagnostic, including                            collection of specimen(s) by brushing or washing,                            when performed (separate procedure)                           99152, Moderate sedation services provided by the                            same physician or other qualified health care                            professional performing the diagnostic or                            therapeutic service that the sedation  supports,                            requiring the presence of an independent trained                            observer to assist in the monitoring of the                            patient's level of consciousness and physiological                            status; initial 15 minutes of intraservice time,                            patient age 19 years or older Diagnosis Code(s):        --- Professional ---                           K64.8, Other hemorrhoids                           K62.5, Hemorrhage of anus and rectum                           R93.3, Abnormal findings on diagnostic imaging of                            other parts of digestive tract CPT copyright 2016 American Medical Association. All rights reserved. The codes documented in this report are preliminary and upon coder review may  be revised to meet current compliance requirements. Lionel December, MD Lionel December, MD 10/09/2015 2:53:54 PM This report has been signed electronically. Number of Addenda: 0

## 2015-10-09 NOTE — H&P (Signed)
Karen Obrien is an 45 y.o. female.   Chief Complaint: Patient is here for colonoscopy. HPI: Karen Obrien is 45 year old Caucasian female who was seen in the office but 7 weeks ago for rectal bleeding LLQ abdominal pain. Karen Obrien had abdominopelvic CT which suggested mild changes of colitis involving sigmoid colon and rectum. Karen Obrien was treated with Cipro and Flagyl. Karen Obrien has not had any more episodes of rectal bleeding abdominal pain. Karen Obrien is undergoing diagnostic colonoscopy. Family history is negative for CRC or IBD. Last colonoscopy was in 2010.  Past Medical History:  Diagnosis Date  . Anxiety   . GERD (gastroesophageal reflux disease)   . High cholesterol   . Hx of cervical cancer   . Hypothyroidism     Past Surgical History:  Procedure Laterality Date  . COLONOSCOPY     Dr. Talbert NanKlapian , Dr.Shiflett  . ESOPHAGEAL MANOMETRY N/A 10/01/2014   Procedure: ESOPHAGEAL MANOMETRY (EM);  Surgeon: Louis Meckelobert D Kaplan, MD;  Location: [facility];  Service: Endoscopy;  Laterality: N/A;  . PH IMPEDANCE STUDY N/A 10/01/2014   Procedure: PH IMPEDANCE STUDY;  Surgeon: Louis Meckelobert D Kaplan, MD;  Location: [facility];  Service: Endoscopy;  Laterality: N/A;  . RADICAL HYSTERECTOMY  2004  . UPPER GASTROINTESTINAL ENDOSCOPY  08/2014   Dr. Joselyn GlassmanKlapan    Family History  Problem Relation Age of Onset  . Non-Hodgkin's lymphoma Father   . Hypertension Mother   . Heart murmur Son   . Esophagitis Son   . Crohn's disease Sister    Social History:  reports that Karen Obrien has never smoked. Karen Obrien has never used smokeless tobacco. Karen Obrien reports that Karen Obrien drinks alcohol. Karen Obrien reports that Karen Obrien does not use drugs.  Allergies:  Allergies  Allergen Reactions  . Codeine   . Hydrocodone   . Sulfonamide Derivatives     Medications Prior to Admission  Medication Sig Dispense Refill  . ALPRAZolam (XANAX) 0.5 MG tablet Take 0.5 mg by mouth as needed for anxiety.    . cetirizine (ZYRTEC) 10 MG tablet Take 10 mg by mouth daily. Reported on 03/12/2015     . ciprofloxacin (CIPRO) 500 MG tablet Take 1 tablet (500 mg total) by mouth 2 (two) times daily. 20 tablet 0  . citalopram (CELEXA) 40 MG tablet Take 20 mg by mouth daily.     Marland Kitchen. levothyroxine (SYNTHROID, LEVOTHROID) 100 MCG tablet Take 100 mcg by mouth daily before breakfast.    . metroNIDAZOLE (FLAGYL) 250 MG tablet Take 1 tablet (250 mg total) by mouth 3 (three) times daily. 30 tablet 0  . nitrofurantoin, macrocrystal-monohydrate, (MACROBID) 100 MG capsule Take 100 mg by mouth daily.    . pantoprazole (PROTONIX) 40 MG tablet Take 1 tablet (40 mg total) by mouth 2 (two) times daily before a meal. 60 tablet 5  . rosuvastatin (CRESTOR) 10 MG tablet Take 5 mg by mouth daily.       No results found for this or any previous visit (from the past 48 hour(s)). No results found.  ROS  Blood pressure 101/73, pulse 68, temperature 98.4 F (36.9 C), temperature source Oral, resp. rate 14, height 5\' 6"  (1.676 m), weight 160 lb (72.6 kg), SpO2 95 %. Physical Exam  Constitutional: Karen Obrien appears well-developed and well-nourished.  HENT:  Mouth/Throat: Oropharynx is clear and moist.  Eyes: Conjunctivae are normal. No scleral icterus.  Neck: No thyromegaly present.  Cardiovascular: Normal rate and regular rhythm.   No murmur heard. Respiratory: Effort normal and breath sounds normal.  GI: Soft. Karen Obrien exhibits  no distension and no mass. There is no tenderness.  Lymphadenopathy:    Karen Obrien has no cervical adenopathy.  Neurological: Karen Obrien is alert.  Skin: Skin is warm and dry.     Assessment/Plan History of rectal bleeding and ? mild colitis. Diagnostic colonoscopy.  Lionel December, MD 10/09/2015, 2:17 PM

## 2015-10-16 ENCOUNTER — Encounter (HOSPITAL_COMMUNITY): Payer: Self-pay | Admitting: Internal Medicine

## 2016-09-25 ENCOUNTER — Other Ambulatory Visit (INDEPENDENT_AMBULATORY_CARE_PROVIDER_SITE_OTHER): Payer: Self-pay | Admitting: Internal Medicine

## 2017-07-15 IMAGING — NM NM GASTRIC EMPTYING
10 series · 10 of 10 positions shown · non-contrast
Comparison: None.

CLINICAL DATA: Reflux.

EXAM:
NUCLEAR MEDICINE GASTRIC EMPTYING SCAN
TECHNIQUE: After oral ingestion of radiolabeled meal, sequential abdominal
images were obtained for 4 hours. Percentage of activity emptying
the stomach was calculated at 1 hour, 2 hour, 3 hour, and 4 hours.
RADIOPHARMACEUTICALS:  2.0 mCi Ic-22m MDP labeled sulfur colloid
orally

[Series 1: 0 min · 4.14mm/px · 1 of 1 slices shown (1 of 2)]
[im 1/1]
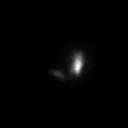

[Series 1: 120 min · 4.14mm/px · 1 of 1 slices shown (1 of 4)]
[im 1/1]
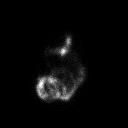

[Series 1: 120 min · 4.14mm/px · 1 of 1 slices shown (2 of 4)]
[im 1/1  full-range]
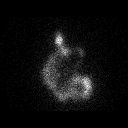

[Series 1: 0 min · 4.14mm/px · 1 of 1 slices shown (2 of 2)]
[im 1/1]
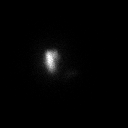

[Series 2: 4 hour · 4.14mm/px · 1 of 1 slices shown (1 of 2)]
[im 1/1]
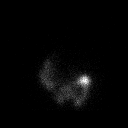

[Series 2: 4 hour · 4.14mm/px · 1 of 1 slices shown (2 of 2)]
[im 1/1]
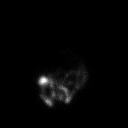

[Series 2: 60 min · 4.14mm/px · 1 of 1 slices shown (1 of 2)]
[im 1/1]
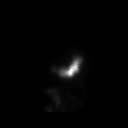

[Series 2: 60 min · 4.14mm/px · 1 of 1 slices shown (2 of 2)]
[im 1/1]
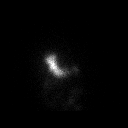

[Series 3: 120 min · 4.14mm/px · 1 of 1 slices shown (3 of 4)]
[im 1/1]
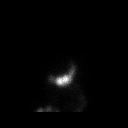

[Series 3: 120 min · 4.14mm/px · 1 of 1 slices shown (4 of 4)]
[im 1/1]
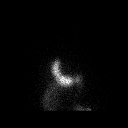

[10 of 10 positions shown; findings below may reference images not displayed]

FINDINGS: Expected location of the stomach in the left upper quadrant.
Ingested meal empties the stomach gradually over the course of the
study.

12.8% emptied at 1 hr ( normal >= 10%)

41.3% emptied at 2 hr ( normal >= 40%)

73.3% emptied at 3 hr ( normal >= 70%)

95.9% emptied at 4 hr ( normal >= 90%)
IMPRESSION: Normal gastric emptying study.
